# Patient Record
Sex: Male | Born: 1970 | Race: White | Hispanic: No | Smoking: Current every day smoker
Health system: Southern US, Community
[De-identification: ages and names within clinical notes are randomized; demographics above are authoritative.]

## PROBLEM LIST (undated history)

## (undated) DIAGNOSIS — G43909 Migraine, unspecified, not intractable, without status migrainosus: Secondary | ICD-10-CM

## (undated) DIAGNOSIS — I4891 Unspecified atrial fibrillation: Secondary | ICD-10-CM

## (undated) DIAGNOSIS — R519 Headache, unspecified: Secondary | ICD-10-CM

## (undated) DIAGNOSIS — R51 Headache: Secondary | ICD-10-CM

## (undated) HISTORY — PX: WISDOM TOOTH EXTRACTION: SHX21

---

## 2001-10-08 ENCOUNTER — Encounter: Admission: RE | Admit: 2001-10-08 | Discharge: 2001-10-08 | Payer: Self-pay | Admitting: Family Medicine

## 2001-10-08 ENCOUNTER — Encounter: Payer: Self-pay | Admitting: Family Medicine

## 2005-06-22 ENCOUNTER — Emergency Department (HOSPITAL_COMMUNITY): Admission: EM | Admit: 2005-06-22 | Discharge: 2005-06-22 | Payer: Self-pay | Admitting: Emergency Medicine

## 2015-08-03 ENCOUNTER — Encounter (HOSPITAL_COMMUNITY): Payer: Self-pay | Admitting: Emergency Medicine

## 2015-08-03 ENCOUNTER — Observation Stay (HOSPITAL_COMMUNITY)
Admission: EM | Admit: 2015-08-03 | Discharge: 2015-08-05 | Disposition: A | Discharge status: Home / Self Care | Payer: Commercial Managed Care - HMO | Attending: General Surgery | Admitting: General Surgery

## 2015-08-03 ENCOUNTER — Emergency Department (HOSPITAL_COMMUNITY): Payer: Commercial Managed Care - HMO

## 2015-08-03 ENCOUNTER — Observation Stay (HOSPITAL_COMMUNITY): Payer: Commercial Managed Care - HMO

## 2015-08-03 DIAGNOSIS — Z01810 Encounter for preprocedural cardiovascular examination: Secondary | ICD-10-CM

## 2015-08-03 DIAGNOSIS — K810 Acute cholecystitis: Secondary | ICD-10-CM

## 2015-08-03 DIAGNOSIS — R109 Unspecified abdominal pain: Secondary | ICD-10-CM

## 2015-08-03 DIAGNOSIS — F17210 Nicotine dependence, cigarettes, uncomplicated: Secondary | ICD-10-CM | POA: Diagnosis not present

## 2015-08-03 DIAGNOSIS — K8012 Calculus of gallbladder with acute and chronic cholecystitis without obstruction: Principal | ICD-10-CM | POA: Insufficient documentation

## 2015-08-03 DIAGNOSIS — R1013 Epigastric pain: Secondary | ICD-10-CM

## 2015-08-03 DIAGNOSIS — I4891 Unspecified atrial fibrillation: Secondary | ICD-10-CM | POA: Diagnosis not present

## 2015-08-03 DIAGNOSIS — R079 Chest pain, unspecified: Secondary | ICD-10-CM | POA: Diagnosis present

## 2015-08-03 HISTORY — DX: Headache: R51

## 2015-08-03 HISTORY — DX: Headache, unspecified: R51.9

## 2015-08-03 HISTORY — DX: Unspecified atrial fibrillation: I48.91

## 2015-08-03 HISTORY — DX: Migraine, unspecified, not intractable, without status migrainosus: G43.909

## 2015-08-03 LAB — I-STAT CHEM 8, ED
BUN: 11 mg/dL (ref 6–20)
CALCIUM ION: 1.06 mmol/L — AB (ref 1.12–1.23)
Chloride: 103 mmol/L (ref 101–111)
Creatinine, Ser: 0.8 mg/dL (ref 0.61–1.24)
Glucose, Bld: 102 mg/dL — ABNORMAL HIGH (ref 65–99)
HCT: 48 % (ref 39.0–52.0)
HEMOGLOBIN: 16.3 g/dL (ref 13.0–17.0)
Potassium: 4.4 mmol/L (ref 3.5–5.1)
SODIUM: 138 mmol/L (ref 135–145)
TCO2: 23 mmol/L (ref 0–100)

## 2015-08-03 LAB — COMPREHENSIVE METABOLIC PANEL
ALT: 18 U/L (ref 17–63)
ANION GAP: 9 (ref 5–15)
AST: 23 U/L (ref 15–41)
Albumin: 3.7 g/dL (ref 3.5–5.0)
Alkaline Phosphatase: 46 U/L (ref 38–126)
BUN: 7 mg/dL (ref 6–20)
CALCIUM: 8.9 mg/dL (ref 8.9–10.3)
CO2: 22 mmol/L (ref 22–32)
Chloride: 105 mmol/L (ref 101–111)
Creatinine, Ser: 0.86 mg/dL (ref 0.61–1.24)
GFR calc Af Amer: >60 mL/min (ref >60)
GLUCOSE: 106 mg/dL — AB (ref 65–99)
Potassium: 4.2 mmol/L (ref 3.5–5.1)
Sodium: 136 mmol/L (ref 135–145)
TOTAL PROTEIN: 6.4 g/dL — AB (ref 6.5–8.1)
Total Bilirubin: 0.7 mg/dL (ref 0.3–1.2)

## 2015-08-03 LAB — CBC WITH DIFFERENTIAL/PLATELET
Basophils Absolute: 0 10*3/uL (ref 0.0–0.1)
Basophils Relative: 0 %
Eosinophils Absolute: 0 10*3/uL (ref 0.0–0.7)
Eosinophils Relative: 0 %
HEMATOCRIT: 44.5 % (ref 39.0–52.0)
HEMOGLOBIN: 15.2 g/dL (ref 13.0–17.0)
LYMPHS ABS: 1.6 10*3/uL (ref 0.7–4.0)
LYMPHS PCT: 15 %
MCH: 31.3 pg (ref 26.0–34.0)
MCHC: 34.2 g/dL (ref 30.0–36.0)
MCV: 91.8 fL (ref 78.0–100.0)
MONOS PCT: 9 %
Monocytes Absolute: 1 10*3/uL (ref 0.1–1.0)
NEUTROS ABS: 8.2 10*3/uL — AB (ref 1.7–7.7)
NEUTROS PCT: 76 %
Platelets: 231 10*3/uL (ref 150–400)
RBC: 4.85 MIL/uL (ref 4.22–5.81)
RDW: 12.2 % (ref 11.5–15.5)
WBC: 10.8 10*3/uL — ABNORMAL HIGH (ref 4.0–10.5)

## 2015-08-03 LAB — SURGICAL PCR SCREEN
MRSA, PCR: NEGATIVE
STAPHYLOCOCCUS AUREUS: NEGATIVE

## 2015-08-03 LAB — I-STAT TROPONIN, ED
TROPONIN I, POC: 0 ng/mL (ref 0.00–0.08)
Troponin i, poc: 0 ng/mL (ref 0.00–0.08)
Troponin i, poc: 0 ng/mL (ref 0.00–0.08)

## 2015-08-03 LAB — MAGNESIUM: Magnesium: 1.7 mg/dL (ref 1.7–2.4)

## 2015-08-03 LAB — CREATININE, SERUM
Creatinine, Ser: 0.91 mg/dL (ref 0.61–1.24)
GFR calc Af Amer: >60 mL/min (ref >60)

## 2015-08-03 LAB — LIPASE, BLOOD: Lipase: 16 U/L (ref 11–51)

## 2015-08-03 MED ORDER — PIPERACILLIN-TAZOBACTAM 3.375 G IVPB 30 MIN
3.3750 g | Freq: Once | INTRAVENOUS | Status: AC
  Administered 2015-08-03: 3.375 g via INTRAVENOUS
  Filled 2015-08-03: qty 50

## 2015-08-03 MED ORDER — DEXTROSE 5 % IV SOLN
2.0000 g | INTRAVENOUS | Status: DC
  Administered 2015-08-03 – 2015-08-04 (×2): 2 g via INTRAVENOUS
  Filled 2015-08-03 (×2): qty 2

## 2015-08-03 MED ORDER — IOPAMIDOL (ISOVUE-370) INJECTION 76%
INTRAVENOUS | Status: AC
  Administered 2015-08-03: 100 mL
  Filled 2015-08-03: qty 100

## 2015-08-03 MED ORDER — DIPHENHYDRAMINE HCL 25 MG PO CAPS
25.0000 mg | ORAL_CAPSULE | Freq: Four times a day (QID) | ORAL | Status: DC | PRN
  Administered 2015-08-04: 25 mg via ORAL
  Filled 2015-08-03: qty 1

## 2015-08-03 MED ORDER — DIPHENHYDRAMINE HCL 50 MG/ML IJ SOLN: 25.0000 mg | Freq: Four times a day (QID) | INTRAMUSCULAR | Status: DC | PRN

## 2015-08-03 MED ORDER — ACETAMINOPHEN 650 MG RE SUPP: 650.0000 mg | Freq: Four times a day (QID) | RECTAL | Status: DC | PRN

## 2015-08-03 MED ORDER — SODIUM CHLORIDE 0.9 % IV BOLUS (SEPSIS)
1000.0000 mL | Freq: Once | INTRAVENOUS | Status: AC
  Administered 2015-08-03: 1000 mL via INTRAVENOUS

## 2015-08-03 MED ORDER — ONDANSETRON 4 MG PO TBDP: 4.0000 mg | ORAL_TABLET | Freq: Four times a day (QID) | ORAL | Status: DC | PRN

## 2015-08-03 MED ORDER — HYDROMORPHONE HCL 1 MG/ML IJ SOLN
1.0000 mg | Freq: Once | INTRAMUSCULAR | Status: AC
  Administered 2015-08-03: 1 mg via INTRAVENOUS
  Filled 2015-08-03: qty 1

## 2015-08-03 MED ORDER — NICOTINE 21 MG/24HR TD PT24
21.0000 mg | MEDICATED_PATCH | Freq: Every day | TRANSDERMAL | Status: DC
  Administered 2015-08-03 – 2015-08-05 (×3): 21 mg via TRANSDERMAL
  Filled 2015-08-03 (×3): qty 1

## 2015-08-03 MED ORDER — ACETAMINOPHEN 325 MG PO TABS
650.0000 mg | ORAL_TABLET | Freq: Four times a day (QID) | ORAL | Status: DC | PRN
  Administered 2015-08-03 – 2015-08-04 (×2): 650 mg via ORAL
  Filled 2015-08-03 (×2): qty 2

## 2015-08-03 MED ORDER — HYDROMORPHONE HCL 1 MG/ML IJ SOLN
1.0000 mg | INTRAMUSCULAR | Status: DC | PRN
  Administered 2015-08-03: 2 mg via INTRAVENOUS
  Administered 2015-08-04: 1 mg via INTRAVENOUS
  Administered 2015-08-04: 2 mg via INTRAVENOUS
  Filled 2015-08-03 (×2): qty 2
  Filled 2015-08-03: qty 1

## 2015-08-03 MED ORDER — OXYCODONE-ACETAMINOPHEN 5-325 MG PO TABS
1.0000 | ORAL_TABLET | ORAL | Status: DC | PRN
  Administered 2015-08-03 – 2015-08-05 (×4): 2 via ORAL
  Filled 2015-08-03 (×4): qty 2

## 2015-08-03 MED ORDER — HYDROMORPHONE HCL 1 MG/ML IJ SOLN
1.0000 mg | INTRAMUSCULAR | Status: DC | PRN
Start: 2015-08-03 — End: 2015-08-03

## 2015-08-03 MED ORDER — ENOXAPARIN SODIUM 40 MG/0.4ML ~~LOC~~ SOLN
40.0000 mg | SUBCUTANEOUS | Status: DC
  Administered 2015-08-03 – 2015-08-04 (×2): 40 mg via SUBCUTANEOUS
  Filled 2015-08-03 (×2): qty 0.4

## 2015-08-03 MED ORDER — ONDANSETRON HCL 4 MG/2ML IJ SOLN
4.0000 mg | Freq: Four times a day (QID) | INTRAMUSCULAR | Status: DC | PRN
  Administered 2015-08-03: 4 mg via INTRAVENOUS
  Filled 2015-08-03: qty 2

## 2015-08-03 NOTE — ED Provider Notes (Signed)
CSN: [id number]     Arrival date & time 08/03/15  [id number] History   First MD Initiated Contact with Patient 08/03/15 [phone]     Chief Complaint  Patient presents with  . Chest Pain     (Consider location/radiation/quality/duration/timing/severity/associated sxs/prior Treatment) Patient is a 45 y.o. male presenting with chest pain.  Chest Pain Pain location:  Epigastric Pain quality comment:  "like I was hit" Pain radiates to the back: no   Pain severity:  Severe Onset quality:  Sudden Duration:  7 hours Timing:  Constant Progression:  Worsening Chronicity:  New Context comment:  Takes goody powders regularly Relieved by:  Nothing Worsened by:  Nothing tried Ineffective treatments:  Certain positions, leaning forward and nitroglycerin Associated symptoms: abdominal pain   Associated symptoms: no back pain, no cough, no diaphoresis, no fever, no headache, no nausea, no shortness of breath, no syncope and no weakness   Risk factors: smoking   Risk factors: no coronary artery disease, no diabetes mellitus, no high cholesterol and no hypertension     Past Medical History  Diagnosis Date  . Atrial fibrillation (HCC)   . Headache     "q couple weeks" (08/03/2015)  . Migraine     "a couple/year" (08/03/2015)   Past Surgical History  Procedure Laterality Date  . Wisdom tooth extraction  ~ 2015   History reviewed. No pertinent family history. Social History  Substance Use Topics  . Smoking status: Current Every Day Smoker -- 1.00 packs/day for 27 years    Types: Cigarettes  . Smokeless tobacco: Never Used  . Alcohol Use: 12.6 oz/week    21 Cans of beer per week     Comment: 2-3 beers per day    Review of Systems  Constitutional: Negative for fever and diaphoresis.  HENT: Negative for sore throat.   Eyes: Negative for visual disturbance.  Respiratory: Negative for cough and shortness of breath.   Cardiovascular: Positive for chest pain. Negative for syncope.  Gastrointestinal:  Positive for abdominal pain. Negative for nausea.  Genitourinary: Negative for difficulty urinating.  Musculoskeletal: Negative for back pain and neck stiffness.  Skin: Negative for rash.  Neurological: Negative for syncope, weakness and headaches.      Allergies  Review of patient's allergies indicates no known allergies.  Home Medications   Prior to Admission medications   Medication Sig Start Date End Date Taking? Authorizing Provider  Aspirin-Acetaminophen-Caffeine (GOODY HEADACHE PO) Take 2 packets by mouth every 6 (six) hours as needed (pain).   Yes Historical Provider, MD  Diphenhydramine-APAP, sleep, (GOODY PM PO) Take 2 Packages by mouth at bedtime as needed (pain).   Yes Historical Provider, MD   BP 133/78 mmHg  Pulse 88  Temp(Src) 98.7 F (37.1 C) (Oral)  Resp 18  Ht 6' (1.829 m)  Wt 207 lb 3.2 oz (93.985 kg)  BMI 28.10 kg/m2  SpO2 96% Physical Exam  Constitutional: He is oriented to person, place, and time. He appears well-developed and well-nourished. No distress.  HENT:  Head: Normocephalic and atraumatic.  Eyes: Conjunctivae and EOM are normal.  Neck: Normal range of motion.  Cardiovascular: Normal rate, regular rhythm, normal heart sounds and intact distal pulses.  Exam reveals no gallop and no friction rub.   No murmur heard. Pulmonary/Chest: Effort normal and breath sounds normal. No respiratory distress. He has no wheezes. He has no rales.  Abdominal: He exhibits no distension. There is tenderness (guarding throughout, worse in epigastrum) in the epigastric area. There is guarding  and CVA tenderness (left). Rebound: reports they feel the same.  Musculoskeletal: He exhibits no edema.  Neurological: He is alert and oriented to person, place, and time.  Skin: Skin is warm and dry. He is not diaphoretic.  Nursing note and vitals reviewed.   ED Course  Procedures (including critical care time) Labs Review Labs Reviewed  COMPREHENSIVE METABOLIC PANEL -  Abnormal; Notable for the following:    Glucose, Bld 106 (*)    Total Protein 6.4 (*)    All other components within normal limits  CBC WITH DIFFERENTIAL/PLATELET - Abnormal; Notable for the following:    WBC 10.8 (*)    Neutro Abs 8.2 (*)    All other components within normal limits  I-STAT CHEM 8, ED - Abnormal; Notable for the following:    Glucose, Bld 102 (*)    Calcium, Ion 1.06 (*)    All other components within normal limits  SURGICAL PCR SCREEN  MAGNESIUM  LIPASE, BLOOD  CREATININE, SERUM  COMPREHENSIVE METABOLIC PANEL  CBC  I-STAT TROPOININ, ED  I-STAT TROPOININ, ED  I-STAT TROPOININ, ED    Imaging Review Dg Abd Acute W/chest  08/03/2015  CLINICAL DATA:  Severe upper abdominal pain beginning at 1 a.m. this morning. Initial encounter. EXAM: DG ABDOMEN ACUTE W/ 1V CHEST COMPARISON:  Plain film of the chest 05/05/2014. FINDINGS: Single view of the chest demonstrates clear lungs and normal heart size. No pneumothorax or pleural effusion. Two views of the abdomen show no free intraperitoneal air. There is no evidence of bowel obstruction. Large volume of stool ascending and transverse colon noted. No abnormal abdominal calcification or focal bony abnormality. IMPRESSION: No acute finding chest or abdomen. Large stool burden ascending and transverse colon. Electronically Signed   By: Drusilla Kanner M.D.   On: 08/03/2015 08:32   Ct Angio Chest/abd/pel For Dissection W And/or W/wo  08/03/2015  CLINICAL DATA:  Chest and abdominal pain for 1 day. History of atrial fibrillation EXAM: CT ANGIOGRAPHY CHEST, ABDOMEN AND PELVIS TECHNIQUE: Initially, axial CT images were obtained through the chest without intravenous contrast material administration. Multidetector CT imaging through the chest, abdomen and pelvis was performed using the standard protocol during bolus administration of intravenous contrast. Multiplanar reconstructed images and MIPs were obtained and reviewed to evaluate the  vascular anatomy. CONTRAST:  100 mL Isovue 370 nonionic COMPARISON:  Abdomen series Aug 03, 2015 FINDINGS: CTA CHEST FINDINGS There is no thoracic aortic aneurysm or dissection. Visualized great vessels appear normal. There are a few small areas of calcification in coronary arteries. Pericardium is not appreciably thickened. There is no demonstrable pulmonary embolus. Visualized thyroid appears unremarkable. There is no appreciable thoracic adenopathy. There is a minimal hiatal hernia. There are scattered areas of patchy atelectasis in both lower lobes. There is no lung edema or consolidation. There are no blastic or lytic bone lesions. Review of the MIP images confirms the above findings. CTA ABDOMEN AND PELVIS FINDINGS There is a degree of hepatic steatosis. No focal liver lesions are identified. There is pericholecystic fluid surrounding a gallbladder which appears somewhat distended. Gallbladder wall also appears slightly thickened. There is no appreciable biliary duct dilatation. Pancreas appears normal without mass or inflammatory focus. No splenic lesions are evident. Right adrenal appears normal. There is a 9 x 9 mm left adrenal adenoma. Kidneys bilaterally show no hydronephrosis. There is a cyst measuring 8 x 8 mm arising from the lower pole the right kidney. There is a cyst arising from the lower pole  left kidney measuring 1.1 x 1.1 cm. There is a cyst in upper pole left kidney anteriorly measuring 1.1 x 1.1 cm. There is no renal or ureteral calculus on either side. Urinary bladder is midline with wall thickness within normal limits. The abdominal aorta shows no evidence of aneurysm or dissection. There is mild atherosclerotic plaque in the distal aorta without appreciable vessel narrowing. The celiac, superior mesenteric, and inferior mesenteric arteries appear widely patent. Single renal arteries are patent bilaterally. In the pelvis, there is no aneurysm or dissection. There is no appreciable vessel  narrowing involving major pelvic arterial vessels. In the pelvis, there is no mass or inflammatory focus. Prostate and seminal vesicles appear normal in size and contour. There is no bowel wall or mesenteric thickening. No bowel obstruction. No free air or portal venous air. The appendix appears normal. There is no ascites, adenopathy, or abscess in the abdomen or pelvis. There are no blastic or lytic bone lesions. There is no intramuscular or abdominal wall lesion. There is a minimal ventral hernia containing only fat. Review of the MIP images confirms the above findings. IMPRESSION: CT angiogram chest: Mild lower lobe atelectatic change. No edema or consolidation. No thoracic adenopathy. No thoracic aortic aneurysm or dissection. No pulmonary embolus evident. CT angiogram abdomen and pelvis: Findings concerning for acalculus cholecystitis. Correlation with ultrasound of the gallbladder advised. Evidence hepatic steatosis.  No focal liver lesions evident. Mild atherosclerotic change in the distal abdominal aorta. No aortic or major pelvic arterial vessel aneurysm or dissection. Major mesenteric vessels as well as the pelvic arterial vessels appear widely patent. No bowel obstruction. No bowel inflammation. Appendix appears normal. No abscess or ascites. No renal or ureteral calculus. No hydronephrosis. Minimal ventral hernia containing only fat. Electronically Signed   By: Bretta Bang III M.D.   On: 08/03/2015 09:54   US Abdomen Limited Ruq  08/03/2015  CLINICAL DATA:  Epigastric pain since 1 a.m. last night. No known injury. Initial encounter. EXAM: US ABDOMEN LIMITED - RIGHT UPPER QUADRANT COMPARISON:  CT chest, abdomen and pelvis 08/03/2015. FINDINGS: Gallbladder: Innumerable stones are identified in the gallbladder including a 2.5 cm stone in the gallbladder neck. The gallbladder wall is thickened at 0.8 cm. No pericholecystic fluid is identified. Sonographer reports a positive Murphy sign. Common bile  duct: Diameter: 0.6 cm Liver: A 1.1 cm in diameter hyperechoic lesion in the right hepatic lobe is most compatible with a hemangioma. Within normal limits in parenchymal echogenicity. IMPRESSION: Findings consistent with acute cholecystitis and patient with multiple gallstones including a 2.5 cm stone in the gallbladder neck. Electronically Signed   By: Drusilla Kanner M.D.   On: 08/03/2015 11:10   I have personally reviewed and evaluated these images and lab results as part of my medical decision-making.   EKG Interpretation   Date/Time:  Monday Aug 03 2015 07:54:12 EDT Ventricular Rate:  90 PR Interval:  116 QRS Duration: 84 QT Interval:  326 QTC Calculation: 399 R Axis:   101 Text Interpretation:  Sinus rhythm Borderline short PR interval Right axis  deviation No significant change since last tracing Confirmed by [facility]  MD, Devin Foskey ([id number]) on 08/03/2015 9:00:10 AM      MDM   Final diagnoses:  Abdominal pain  Epigastric abdominal pain  Acute cholecystitis   45 year old male with hx of smoking, (prior stress testing however did not see other physicians after given lack of insurance) presents with concern for lower chest/epigastric pain.  Sudden onset and severe pain with  perforated viscous in differential and acute abdomen with chest ordered for evaluation which did not show acute findings. EKG without acute changes.    CTA dissection study was ordered which showed no sign of dissection. There were findings concerning for a calculus cholecystitis on CT, and right upper quadrant ultrasound was subsequently ordered.  Patient is low risk HEART score with 2 negative troponins.    RUQ US shows acute cholecystitis.  General surgery consulted. Pt given zosyn and dilaudid.   Alvira Monday, MD 08/03/15 [id number]

## 2015-08-03 NOTE — H&P (Signed)
Chief Complaint: abdominal pain HPI: Timothy Glover is a 45 year old male with a history of tobacco use and remote atrial fibrillation who presents with epigastric abdominal pain which started suddenly at 1AM this morning.  He reports intermittent pain between 1-3AM over the last several months, but would alleviate on its own.  Symptoms are severe. Time pattern is constant.  No aggravating or alleviating factors.  Denies previous post prandial symptoms.  Denies fever, chills or sweats.  Denies diarrhea, constipation.  ED work up reveals, WBC 10.8k.  Normal LFTs.  CT angio significant for acute cholecystitis.  Followed by an [place] which confirmed stones and cholecystitis.  We have therefore been asked to evaluate.   Past Medical History  Diagnosis Date  . Atrial fibrillation ([place])     History reviewed. No pertinent past surgical history.  History reviewed. No pertinent family history. Social History:  reports that he has been smoking Cigarettes.  He does not have any smokeless tobacco history on file. He reports that he drinks alcohol. His drug history is not on file.  Allergies: No Known Allergies   (Not in a hospital admission)  Results for orders placed or performed during the hospital encounter of 08/03/15 (from the past 48 hour(s))  Comprehensive metabolic panel     Status: Abnormal   Collection Time: 08/03/15  8:12 AM  Result Value Ref Range   Sodium 136 135 - 145 mmol/L   Potassium 4.2 3.5 - 5.1 mmol/L   Chloride 105 101 - 111 mmol/L   CO2 22 22 - 32 mmol/L   Glucose, Bld 106 (H) 65 - 99 mg/dL   BUN 7 6 - 20 mg/dL   Creatinine, Ser 0.86 0.61 - 1.24 mg/dL   Calcium 8.9 8.9 - 10.3 mg/dL   Total Protein 6.4 (L) 6.5 - 8.1 g/dL   Albumin 3.7 3.5 - 5.0 g/dL   AST 23 15 - 41 U/L   ALT 18 17 - 63 U/L   Alkaline Phosphatase 46 38 - 126 U/L   Total Bilirubin 0.7 0.3 - 1.2 mg/dL   GFR calc non Af Amer >60 >60 mL/min   GFR calc Af Amer >60 >60 mL/min    Comment: (NOTE) The eGFR has  been calculated using the CKD EPI equation. This calculation has not been validated in all clinical situations. eGFR's persistently <60 mL/min signify possible Chronic Kidney Disease.    Anion gap 9 5 - 15  Magnesium     Status: None   Collection Time: 08/03/15  8:12 AM  Result Value Ref Range   Magnesium 1.7 1.7 - 2.4 mg/dL  Lipase, blood     Status: None   Collection Time: 08/03/15  8:12 AM  Result Value Ref Range   Lipase 16 11 - 51 U/L  I-stat troponin, ED (0, 3, 6)  not at [facility], [facility]     Status: None   Collection Time: 08/03/15  8:19 AM  Result Value Ref Range   Troponin i, poc 0.00 0.00 - 0.08 ng/mL   Comment 3            Comment: Due to the release kinetics of cTnI, a negative result within the first hours of the onset of symptoms does not rule out myocardial infarction with certainty. If myocardial infarction is still suspected, repeat the test at appropriate intervals.   I-Stat Chem 8, ED  (not at [facility], [facility])     Status: Abnormal   Collection Time: 08/03/15  8:20 AM  Result Value Ref Range   Sodium 138 135 - 145 mmol/L   Potassium 4.4 3.5 - 5.1 mmol/L   Chloride 103 101 - 111 mmol/L   BUN 11 6 - 20 mg/dL   Creatinine, Ser 0.80 0.61 - 1.24 mg/dL   Glucose, Bld 102 (H) 65 - 99 mg/dL   Calcium, Ion 1.06 (L) 1.12 - 1.23 mmol/L   TCO2 23 0 - 100 mmol/L   Hemoglobin 16.3 13.0 - 17.0 g/dL   HCT 48.0 39.0 - 52.0 %  I-stat troponin, ED (0, 3, 6)  not at [facility], [facility]     Status: None   Collection Time: 08/03/15 11:33 AM  Result Value Ref Range   Troponin i, poc 0.00 0.00 - 0.08 ng/mL   Comment 3            Comment: Due to the release kinetics of cTnI, a negative result within the first hours of the onset of symptoms does not rule out myocardial infarction with certainty. If myocardial infarction is still suspected, repeat the test at appropriate intervals.   CBC with Differential     Status: Abnormal   Collection Time: 08/03/15 12:25 PM  Result Value Ref Range   WBC  10.8 (H) 4.0 - 10.5 K/uL   RBC 4.85 4.22 - 5.81 MIL/uL   Hemoglobin 15.2 13.0 - 17.0 g/dL   HCT 44.5 39.0 - 52.0 %   MCV 91.8 78.0 - 100.0 fL   MCH 31.3 26.0 - 34.0 pg   MCHC 34.2 30.0 - 36.0 g/dL   RDW 12.2 11.5 - 15.5 %   Platelets 231 150 - 400 K/uL   Neutrophils Relative % 76 %   Neutro Abs 8.2 (H) 1.7 - 7.7 K/uL   Lymphocytes Relative 15 %   Lymphs Abs 1.6 0.7 - 4.0 K/uL   Monocytes Relative 9 %   Monocytes Absolute 1.0 0.1 - 1.0 K/uL   Eosinophils Relative 0 %   Eosinophils Absolute 0.0 0.0 - 0.7 K/uL   Basophils Relative 0 %   Basophils Absolute 0.0 0.0 - 0.1 K/uL   Dg Abd Acute W/chest  08/03/2015  CLINICAL DATA:  Severe upper abdominal pain beginning at 1 a.m. this morning. Initial encounter. EXAM: DG ABDOMEN ACUTE W/ 1V CHEST COMPARISON:  Plain film of the chest 05/05/2014. FINDINGS: Single view of the chest demonstrates clear lungs and normal heart size. No pneumothorax or pleural effusion. Two views of the abdomen show no free intraperitoneal air. There is no evidence of bowel obstruction. Large volume of stool ascending and transverse colon noted. No abnormal abdominal calcification or focal bony abnormality. IMPRESSION: No acute finding chest or abdomen. Large stool burden ascending and transverse colon. Electronically Signed   By: Inge Rise M.D.   On: 08/03/2015 08:32   Ct Angio Chest/abd/pel For Dissection W And/or W/wo  08/03/2015  CLINICAL DATA:  Chest and abdominal pain for 1 day. History of atrial fibrillation EXAM: CT ANGIOGRAPHY CHEST, ABDOMEN AND PELVIS TECHNIQUE: Initially, axial CT images were obtained through the chest without intravenous contrast material administration. Multidetector CT imaging through the chest, abdomen and pelvis was performed using the standard protocol during bolus administration of intravenous contrast. Multiplanar reconstructed images and MIPs were obtained and reviewed to evaluate the vascular anatomy. CONTRAST:  100 mL Isovue 370  nonionic COMPARISON:  Abdomen series Aug 03, 2015 FINDINGS: CTA CHEST FINDINGS There is no thoracic aortic aneurysm or dissection. Visualized great vessels appear normal. There are a few small areas of  calcification in coronary arteries. Pericardium is not appreciably thickened. There is no demonstrable pulmonary embolus. Visualized thyroid appears unremarkable. There is no appreciable thoracic adenopathy. There is a minimal hiatal hernia. There are scattered areas of patchy atelectasis in both lower lobes. There is no lung edema or consolidation. There are no blastic or lytic bone lesions. Review of the MIP images confirms the above findings. CTA ABDOMEN AND PELVIS FINDINGS There is a degree of hepatic steatosis. No focal liver lesions are identified. There is pericholecystic fluid surrounding a gallbladder which appears somewhat distended. Gallbladder wall also appears slightly thickened. There is no appreciable biliary duct dilatation. Pancreas appears normal without mass or inflammatory focus. No splenic lesions are evident. Right adrenal appears normal. There is a 9 x 9 mm left adrenal adenoma. Kidneys bilaterally show no hydronephrosis. There is a cyst measuring 8 x 8 mm arising from the lower pole the right kidney. There is a cyst arising from the lower pole left kidney measuring 1.1 x 1.1 cm. There is a cyst in upper pole left kidney anteriorly measuring 1.1 x 1.1 cm. There is no renal or ureteral calculus on either side. Urinary bladder is midline with wall thickness within normal limits. The abdominal aorta shows no evidence of aneurysm or dissection. There is mild atherosclerotic plaque in the distal aorta without appreciable vessel narrowing. The celiac, superior mesenteric, and inferior mesenteric arteries appear widely patent. Single renal arteries are patent bilaterally. In the pelvis, there is no aneurysm or dissection. There is no appreciable vessel narrowing involving major pelvic arterial vessels.  In the pelvis, there is no mass or inflammatory focus. Prostate and seminal vesicles appear normal in size and contour. There is no bowel wall or mesenteric thickening. No bowel obstruction. No free air or portal venous air. The appendix appears normal. There is no ascites, adenopathy, or abscess in the abdomen or pelvis. There are no blastic or lytic bone lesions. There is no intramuscular or abdominal wall lesion. There is a minimal ventral hernia containing only fat. Review of the MIP images confirms the above findings. IMPRESSION: CT angiogram chest: Mild lower lobe atelectatic change. No edema or consolidation. No thoracic adenopathy. No thoracic aortic aneurysm or dissection. No pulmonary embolus evident. CT angiogram abdomen and pelvis: Findings concerning for acalculus cholecystitis. Correlation with ultrasound of the gallbladder advised. Evidence hepatic steatosis.  No focal liver lesions evident. Mild atherosclerotic change in the distal abdominal aorta. No aortic or major pelvic arterial vessel aneurysm or dissection. Major mesenteric vessels as well as the pelvic arterial vessels appear widely patent. No bowel obstruction. No bowel inflammation. Appendix appears normal. No abscess or ascites. No renal or ureteral calculus. No hydronephrosis. Minimal ventral hernia containing only fat. Electronically Signed   By: Lowella Grip III M.D.   On: 08/03/2015 09:54   US Abdomen Limited Ruq  08/03/2015  CLINICAL DATA:  Epigastric pain since 1 a.m. last night. No known injury. Initial encounter. EXAM: US ABDOMEN LIMITED - RIGHT UPPER QUADRANT COMPARISON:  CT chest, abdomen and pelvis 08/03/2015. FINDINGS: Gallbladder: Innumerable stones are identified in the gallbladder including a 2.5 cm stone in the gallbladder neck. The gallbladder wall is thickened at 0.8 cm. No pericholecystic fluid is identified. Sonographer reports a positive Murphy sign. Common bile duct: Diameter: 0.6 cm Liver: A 1.1 cm in diameter  hyperechoic lesion in the right hepatic lobe is most compatible with a hemangioma. Within normal limits in parenchymal echogenicity. IMPRESSION: Findings consistent with acute cholecystitis and patient with multiple  gallstones including a 2.5 cm stone in the gallbladder neck. Electronically Signed   By: Inge Rise M.D.   On: 08/03/2015 11:10    Review of Systems  Constitutional: Negative for fever, chills, weight loss, malaise/fatigue and diaphoresis.  Eyes: Negative for blurred vision, double vision, photophobia, pain, discharge and redness.  Respiratory: Negative for cough, hemoptysis, sputum production, shortness of breath and wheezing.   Cardiovascular: Negative for chest pain, palpitations, orthopnea, claudication, leg swelling and PND.  Gastrointestinal: Positive for abdominal pain. Negative for heartburn, nausea, vomiting, diarrhea, constipation, blood in stool and melena.  Genitourinary: Negative for dysuria, urgency, frequency, hematuria and flank pain.  Musculoskeletal: Negative for myalgias, back pain, joint pain, falls and neck pain.  Neurological: Negative for dizziness, tingling, tremors, sensory change, speech change, focal weakness, seizures, loss of consciousness and weakness.    Blood pressure 141/95, pulse 97, temperature 98.1 F (36.7 C), temperature source Oral, resp. rate 21, height _0  (1.854 m), weight 111.131 kg (245 lb), SpO2 94 %. Physical Exam  Constitutional: He is oriented to person, place, and time. He appears well-developed and well-nourished. No distress.  Cardiovascular: Normal rate, regular rhythm, normal heart sounds and intact distal pulses.  Exam reveals no gallop and no friction rub.   No murmur heard. Respiratory: Effort normal and breath sounds normal. No respiratory distress. He has no wheezes. He has no rales. He exhibits no tenderness.  GI: Soft. Bowel sounds are normal. He exhibits no distension and no mass.  Mild diffuse tenderness,  voluntary guarding, +murphy's sign  Musculoskeletal: Normal range of motion. He exhibits no edema or tenderness.  Neurological: He is alert and oriented to person, place, and time.  Skin: Skin is warm and dry. No rash noted. He is not diaphoretic. No erythema. No pallor.  Psychiatric: He has a normal mood and affect. His behavior is normal. Judgment and thought content normal.     Assessment/Plan Acute cholecystitis-admit patient for IV antibiotics, pain control, lap chole in AM.  Surgical risks discussed including infection, bleeding, injury to surrounding structures, open surgery, heart attack, DVT/PE, respiratory failure and death.  The patient verbalizes understanding and wishes to proceed. ID-rocephin Nicotine dependence-patch  Hx afib-cardiac monitor FEN-clears, NPO after midnight VTE prophylaxis-SCD lovenox Dispo-admit to floor   Erby Pian, NP 08/03/2015, 2:37 PM

## 2015-08-03 NOTE — ED Notes (Addendum)
xphoidal Cp that woke up this am from sleep about 1 am, coughing up some phlegm had some blood in it ,has iv 18 left ac was given 324 asa, 2 nitro and 6 of morphine no n/v/d/  No sob, states takes 6 goody powders a day for  Neck and back pain

## 2015-08-03 NOTE — ED Notes (Signed)
Patient transported to X-ray 

## 2015-08-03 NOTE — Progress Notes (Signed)
Pt admitted to 6N15 from ED.  Pt AAO X 4.  Pt on RA.  [facility]  Belongings and family member to bedside.  Report rcvd from Madelaine BhatAdam, RN.  Pt has no questions at the moment.  Will continue to monitor.

## 2015-08-04 ENCOUNTER — Observation Stay (HOSPITAL_COMMUNITY): Payer: Commercial Managed Care - HMO | Admitting: Anesthesiology

## 2015-08-04 ENCOUNTER — Encounter (HOSPITAL_COMMUNITY)
Admission: EM | Disposition: A | Discharge status: Home / Self Care | Payer: Self-pay | Source: Home / Self Care | Attending: Emergency Medicine

## 2015-08-04 HISTORY — PX: CHOLECYSTECTOMY: SHX55

## 2015-08-04 LAB — CBC
HCT: 44.9 % (ref 39.0–52.0)
HEMOGLOBIN: 15.3 g/dL (ref 13.0–17.0)
MCH: 31.6 pg (ref 26.0–34.0)
MCHC: 34.1 g/dL (ref 30.0–36.0)
MCV: 92.8 fL (ref 78.0–100.0)
PLATELETS: 261 10*3/uL (ref 150–400)
RBC: 4.84 MIL/uL (ref 4.22–5.81)
RDW: 12.2 % (ref 11.5–15.5)
WBC: 12 10*3/uL — ABNORMAL HIGH (ref 4.0–10.5)

## 2015-08-04 LAB — COMPREHENSIVE METABOLIC PANEL
ALBUMIN: 3.4 g/dL — AB (ref 3.5–5.0)
ALK PHOS: 56 U/L (ref 38–126)
ALT: 35 U/L (ref 17–63)
ANION GAP: 13 (ref 5–15)
AST: 22 U/L (ref 15–41)
BILIRUBIN TOTAL: 0.9 mg/dL (ref 0.3–1.2)
BUN: 6 mg/dL (ref 6–20)
CALCIUM: 8.8 mg/dL — AB (ref 8.9–10.3)
CO2: 24 mmol/L (ref 22–32)
CREATININE: 0.71 mg/dL (ref 0.61–1.24)
Chloride: 98 mmol/L — ABNORMAL LOW (ref 101–111)
GFR calc Af Amer: >60 mL/min (ref >60)
GFR calc non Af Amer: >60 mL/min (ref >60)
GLUCOSE: 105 mg/dL — AB (ref 65–99)
Potassium: 3.8 mmol/L (ref 3.5–5.1)
SODIUM: 135 mmol/L (ref 135–145)
TOTAL PROTEIN: 6.7 g/dL (ref 6.5–8.1)

## 2015-08-04 SURGERY — LAPAROSCOPIC CHOLECYSTECTOMY WITH INTRAOPERATIVE CHOLANGIOGRAM
Anesthesia: General | Site: Abdomen

## 2015-08-04 MED ORDER — FENTANYL CITRATE (PF) 250 MCG/5ML IJ SOLN
INTRAMUSCULAR | Status: AC
  Filled 2015-08-04: qty 5

## 2015-08-04 MED ORDER — SODIUM CHLORIDE 0.9 % IR SOLN
Status: DC | PRN
Start: 2015-08-04 — End: 2015-08-04
  Administered 2015-08-04: 1000 mL

## 2015-08-04 MED ORDER — BUPIVACAINE HCL (PF) 0.25 % IJ SOLN
INTRAMUSCULAR | Status: AC
  Filled 2015-08-04: qty 30

## 2015-08-04 MED ORDER — METOCLOPRAMIDE HCL 5 MG/ML IJ SOLN: 10.0000 mg | Freq: Once | INTRAMUSCULAR | Status: DC | PRN

## 2015-08-04 MED ORDER — ROCURONIUM BROMIDE 50 MG/5ML IV SOLN
INTRAVENOUS | Status: AC
  Filled 2015-08-04: qty 2

## 2015-08-04 MED ORDER — 0.9 % SODIUM CHLORIDE (POUR BTL) OPTIME
TOPICAL | Status: DC | PRN
Start: 2015-08-04 — End: 2015-08-04
  Administered 2015-08-04: 1000 mL

## 2015-08-04 MED ORDER — ROCURONIUM BROMIDE 100 MG/10ML IV SOLN
INTRAVENOUS | Status: DC | PRN
  Administered 2015-08-04: 50 mg via INTRAVENOUS
  Administered 2015-08-04: 10 mg via INTRAVENOUS

## 2015-08-04 MED ORDER — DEXAMETHASONE SODIUM PHOSPHATE 10 MG/ML IJ SOLN
INTRAMUSCULAR | Status: AC
  Filled 2015-08-04: qty 1

## 2015-08-04 MED ORDER — PROPOFOL 10 MG/ML IV BOLUS
INTRAVENOUS | Status: DC | PRN
  Administered 2015-08-04: 200 mg via INTRAVENOUS

## 2015-08-04 MED ORDER — DEXAMETHASONE SODIUM PHOSPHATE 10 MG/ML IJ SOLN
INTRAMUSCULAR | Status: DC | PRN
  Administered 2015-08-04: 10 mg via INTRAVENOUS

## 2015-08-04 MED ORDER — ROCURONIUM BROMIDE 50 MG/5ML IV SOLN
INTRAVENOUS | Status: AC
  Filled 2015-08-04: qty 1

## 2015-08-04 MED ORDER — BUPIVACAINE HCL 0.25 % IJ SOLN
INTRAMUSCULAR | Status: DC | PRN
  Administered 2015-08-04: 7 mL

## 2015-08-04 MED ORDER — ARTIFICIAL TEARS OP OINT
TOPICAL_OINTMENT | OPHTHALMIC | Status: DC | PRN
  Administered 2015-08-04: 1 via OPHTHALMIC

## 2015-08-04 MED ORDER — SUGAMMADEX SODIUM 200 MG/2ML IV SOLN
INTRAVENOUS | Status: DC | PRN
  Administered 2015-08-04: 188 mg via INTRAVENOUS

## 2015-08-04 MED ORDER — IOPAMIDOL (ISOVUE-300) INJECTION 61%
INTRAVENOUS | Status: AC
  Filled 2015-08-04: qty 50

## 2015-08-04 MED ORDER — FENTANYL CITRATE (PF) 100 MCG/2ML IJ SOLN
INTRAMUSCULAR | Status: DC | PRN
  Administered 2015-08-04: 50 ug via INTRAVENOUS
  Administered 2015-08-04: 100 ug via INTRAVENOUS
  Administered 2015-08-04 (×4): 50 ug via INTRAVENOUS

## 2015-08-04 MED ORDER — ONDANSETRON HCL 4 MG/2ML IJ SOLN
INTRAMUSCULAR | Status: AC
  Filled 2015-08-04: qty 4

## 2015-08-04 MED ORDER — LIDOCAINE 2% (20 MG/ML) 5 ML SYRINGE
INTRAMUSCULAR | Status: AC
  Filled 2015-08-04: qty 10

## 2015-08-04 MED ORDER — MIDAZOLAM HCL 5 MG/5ML IJ SOLN
INTRAMUSCULAR | Status: DC | PRN
  Administered 2015-08-04: 2 mg via INTRAVENOUS

## 2015-08-04 MED ORDER — PROPOFOL 10 MG/ML IV BOLUS
INTRAVENOUS | Status: AC
  Filled 2015-08-04: qty 20

## 2015-08-04 MED ORDER — HYDROMORPHONE HCL 1 MG/ML IJ SOLN: 0.2500 mg | INTRAMUSCULAR | Status: DC | PRN

## 2015-08-04 MED ORDER — LIDOCAINE HCL (CARDIAC) 20 MG/ML IV SOLN
INTRAVENOUS | Status: DC | PRN
  Administered 2015-08-04: 100 mg via INTRAVENOUS

## 2015-08-04 MED ORDER — LIDOCAINE 2% (20 MG/ML) 5 ML SYRINGE
INTRAMUSCULAR | Status: AC
  Filled 2015-08-04: qty 5

## 2015-08-04 MED ORDER — LACTATED RINGERS IV SOLN
INTRAVENOUS | Status: DC | PRN
  Administered 2015-08-04: 08:00:00 via INTRAVENOUS

## 2015-08-04 MED ORDER — MIDAZOLAM HCL 2 MG/2ML IJ SOLN
INTRAMUSCULAR | Status: AC
  Filled 2015-08-04: qty 2

## 2015-08-04 MED ORDER — LACTATED RINGERS IV SOLN: INTRAVENOUS | Status: DC

## 2015-08-04 MED ORDER — CEFTRIAXONE SODIUM 2 G IJ SOLR
2.0000 g | INTRAMUSCULAR | Status: DC
  Filled 2015-08-04: qty 2

## 2015-08-04 SURGICAL SUPPLY — 58 items
APL SKNCLS STERI-STRIP NONHPOA (GAUZE/BANDAGES/DRESSINGS) ×1
APPLIER CLIP 5 13 M/L LIGAMAX5 (MISCELLANEOUS)
APR CLP MED LRG 5 ANG JAW (MISCELLANEOUS)
BAG SPEC RTRVL 10 TROC 200 (ENDOMECHANICALS) ×1
BAG SPEC RTRVL LRG 6X4 10 (ENDOMECHANICALS)
BENZOIN TINCTURE PRP APPL 2/3 (GAUZE/BANDAGES/DRESSINGS) ×3 IMPLANT
CANISTER SUCTION 2500CC (MISCELLANEOUS) ×3 IMPLANT
CHLORAPREP W/TINT 26ML (MISCELLANEOUS) ×3 IMPLANT
CLIP APPLIE 5 13 M/L LIGAMAX5 (MISCELLANEOUS) IMPLANT
CLIP LIGATING HEMO O LOK GREEN (MISCELLANEOUS) ×3 IMPLANT
CLOSURE WOUND 1/2 X4 (GAUZE/BANDAGES/DRESSINGS) ×1
COVER MAYO STAND STRL (DRAPES) ×1 IMPLANT
COVER SURGICAL LIGHT HANDLE (MISCELLANEOUS) ×3 IMPLANT
COVER TRANSDUCER ULTRASND (DRAPES) IMPLANT
DEVICE TROCAR PUNCTURE CLOSURE (ENDOMECHANICALS) ×3 IMPLANT
DISSECTOR BLUNT TIP ENDO 5MM (MISCELLANEOUS) ×2 IMPLANT
DRAPE C-ARM 42X72 X-RAY (DRAPES) ×1 IMPLANT
ELECT REM PT RETURN 9FT ADLT (ELECTROSURGICAL) ×3
ELECTRODE REM PT RTRN 9FT ADLT (ELECTROSURGICAL) ×1 IMPLANT
GAUZE SPONGE 2X2 8PLY STRL LF (GAUZE/BANDAGES/DRESSINGS) ×1 IMPLANT
GAUZE SPONGE 4X4 16PLY XRAY LF (GAUZE/BANDAGES/DRESSINGS) ×2 IMPLANT
GLOVE BIO SURGEON STRL SZ7.5 (GLOVE) ×5 IMPLANT
GLOVE BIOGEL PI IND STRL 6.5 (GLOVE) IMPLANT
GLOVE BIOGEL PI IND STRL 7.0 (GLOVE) IMPLANT
GLOVE BIOGEL PI INDICATOR 6.5 (GLOVE) ×2
GLOVE BIOGEL PI INDICATOR 7.0 (GLOVE) ×2
GLOVE ECLIPSE 6.0 STRL STRAW (GLOVE) ×2 IMPLANT
GLOVE SURG SS PI 6.5 STRL IVOR (GLOVE) ×2 IMPLANT
GOWN STRL REUS W/ TWL LRG LVL3 (GOWN DISPOSABLE) ×2 IMPLANT
GOWN STRL REUS W/ TWL XL LVL3 (GOWN DISPOSABLE) ×1 IMPLANT
GOWN STRL REUS W/TWL LRG LVL3 (GOWN DISPOSABLE) ×9
GOWN STRL REUS W/TWL XL LVL3 (GOWN DISPOSABLE) ×3
IV CATH 14GX2 1/4 (CATHETERS) ×1 IMPLANT
KIT BASIN OR (CUSTOM PROCEDURE TRAY) ×3 IMPLANT
KIT ROOM TURNOVER OR (KITS) ×3 IMPLANT
NDL INSUFFLATION 14GA 120MM (NEEDLE) ×1 IMPLANT
NEEDLE INSUFFLATION 14GA 120MM (NEEDLE) ×3 IMPLANT
NS IRRIG 1000ML POUR BTL (IV SOLUTION) ×3 IMPLANT
PAD ARMBOARD 7.5X6 YLW CONV (MISCELLANEOUS) ×6 IMPLANT
POUCH RETRIEVAL ECOSAC 10 (ENDOMECHANICALS) IMPLANT
POUCH RETRIEVAL ECOSAC 10MM (ENDOMECHANICALS) ×2
POUCH SPECIMEN RETRIEVAL 10MM (ENDOMECHANICALS) IMPLANT
SCISSORS LAP 5X35 DISP (ENDOMECHANICALS) ×3 IMPLANT
SET CHOLANGIOGRAPHY FRANKLIN (SET/KITS/TRAYS/PACK) ×1 IMPLANT
SET IRRIG TUBING LAPAROSCOPIC (IRRIGATION / IRRIGATOR) ×3 IMPLANT
SLEEVE ENDOPATH XCEL 5M (ENDOMECHANICALS) ×7 IMPLANT
SPECIMEN JAR SMALL (MISCELLANEOUS) ×3 IMPLANT
SPONGE GAUZE 2X2 STER 10/PKG (GAUZE/BANDAGES/DRESSINGS) ×2
STRIP CLOSURE SKIN 1/2X4 (GAUZE/BANDAGES/DRESSINGS) ×2 IMPLANT
SUT MNCRL AB 3-0 PS2 18 (SUTURE) ×5 IMPLANT
SUT VICRYL 0 UR6 27IN ABS (SUTURE) ×2 IMPLANT
TAPE CLOTH SURG 4X10 WHT LF (GAUZE/BANDAGES/DRESSINGS) ×2 IMPLANT
TOWEL OR 17X24 6PK STRL BLUE (TOWEL DISPOSABLE) ×3 IMPLANT
TOWEL OR 17X26 10 PK STRL BLUE (TOWEL DISPOSABLE) ×1 IMPLANT
TRAY LAPAROSCOPIC MC (CUSTOM PROCEDURE TRAY) ×3 IMPLANT
TROCAR XCEL NON-BLD 11X100MML (ENDOMECHANICALS) ×3 IMPLANT
TROCAR XCEL NON-BLD 5MMX100MML (ENDOMECHANICALS) ×3 IMPLANT
TUBING INSUFFLATION (TUBING) ×3 IMPLANT

## 2015-08-04 NOTE — Anesthesia Preprocedure Evaluation (Signed)
Anesthesia Evaluation  Patient identified by MRN, date of birth, ID band Patient awake    Reviewed: Allergy & Precautions, H&P , NPO status , Patient's Chart, lab work & pertinent test results  History of Anesthesia Complications Negative for: history of anesthetic complications  Airway Mallampati: II  TM Distance: >3 FB Neck ROM: full    Dental no notable dental hx.    Pulmonary Current Smoker,    Pulmonary exam normal breath sounds clear to auscultation       Cardiovascular negative cardio ROS Normal cardiovascular exam+ dysrhythmias Atrial Fibrillation  Rhythm:regular Rate:Normal     Neuro/Psych  Headaches, negative neurological ROS     GI/Hepatic negative GI ROS, Neg liver ROS,   Endo/Other  negative endocrine ROS  Renal/GU negative Renal ROS     Musculoskeletal   Abdominal   Peds  Hematology negative hematology ROS (+)   Anesthesia Other Findings   Reproductive/Obstetrics negative OB ROS                             Anesthesia Physical Anesthesia Plan  ASA: II  Anesthesia Plan: General   Post-op Pain Management:    Induction: Intravenous  Airway Management Planned: Oral ETT  Additional Equipment:   Intra-op Plan:   Post-operative Plan: Extubation in OR  Informed Consent: I have reviewed the patients History and Physical, chart, labs and discussed the procedure including the risks, benefits and alternatives for the proposed anesthesia with the patient or authorized representative who has indicated his/her understanding and acceptance.   Dental Advisory Given  Plan Discussed with: Anesthesiologist, CRNA and Surgeon  Anesthesia Plan Comments:         Anesthesia Quick Evaluation

## 2015-08-04 NOTE — Anesthesia Procedure Notes (Signed)
Procedure Name: Intubation Date/Time: 08/04/2015 9:31 AM Performed by: Carmela RimaMARTINELLI, Othello Dickenson F Pre-anesthesia Checklist: Patient being monitored, Suction available, Emergency Drugs available, Patient identified and Timeout performed Patient Re-evaluated:Patient Re-evaluated prior to inductionOxygen Delivery Method: Circle system utilized Preoxygenation: Pre-oxygenation with 100% oxygen Intubation Type: IV induction Ventilation: Mask ventilation without difficulty Laryngoscope Size: Mac and 3 (Recommend Mac 4  anatomy very deep) Grade View: Grade II Tube type: Oral Tube size: 7.5 mm Number of attempts: 1 Placement Confirmation: positive ETCO2,  ETT inserted through vocal cords under direct vision and breath sounds checked- equal and bilateral Secured at: 24 cm Tube secured with: Tape Dental Injury: Teeth and Oropharynx as per pre-operative assessment

## 2015-08-04 NOTE — Progress Notes (Signed)
Rings x 2 given to HobbsKatilyn from floor

## 2015-08-04 NOTE — Op Note (Signed)
08/04/2015  10:47 AM  PATIENT:  Timothy Glover  45 y.o. male  PRE-OPERATIVE DIAGNOSIS:  Acute cholelithiasis  POST-OPERATIVE DIAGNOSIS:  Acute cholelithiasis  PROCEDURE:  Procedure(s): LAPAROSCOPIC CHOLECYSTECTOMY (N/A)  SURGEON:  Surgeon(s) and Role:    * Axel FillerArmando Traven Davids, MD - Primary  ANESTHESIA:   local and general  EBL:   5cc  BLOOD ADMINISTERED:none  DRAINS: none   LOCAL MEDICATIONS USED:  BUPIVICAINE   SPECIMEN:  Source of Specimen:  gallbladder  DISPOSITION OF SPECIMEN:  PATHOLOGY  COUNTS:  YES  TOURNIQUET:  * No tourniquets in log *  DICTATION: .Dragon Dictation The patient was taken to the operating and placed in the supine position with bilateral SCDs in place. The patient was prepped and draped in the usual sterile fashion. A time out was called and all facts were verified. A pneumoperitoneum was obtained via A Veress needle technique to a pressure of 14mm of mercury.  A 5mm trochar was then placed in the right upper quadrant under visualization, and there were no injuries to any abdominal organs. A 11 mm port was then placed in the umbilical region after infiltrating with local anesthesia under direct visualization. A second and third epigastric port and right lower quadrant port placement under direct visualization, respectively. The gallbladder was drained and white bile suctioned out. The gallbladder was identified and retracted, the peritoneum was then sharply dissected from the gallbladder and this dissection was carried down to Calot's triangle. A 4th 5mm trochar was placed in the right subcostal margin to help with retraction of the colon and omentum. The gallbladder was identified and stripped away circumferentially and seen going into the gallbladder 360, the critical angle was obtained.    2 clips were placed proximally one distally and the cystic duct transected. The cystic artery was identified and 2 clips placed proximally and one distally and  transected. We then proceeded to remove the gallbladder off the hepatic fossa with Bovie cautery. A retrieval bag was then placed in the abdomen and gallbladder placed in the bag. The hepatic fossa was then reexamined and hemostasis was achieved with Bovie cautery and was excellent at the end of the case. The subhepatic fossa and perihepatic fossa was then irrigated until the effluent was clear.  The gallbladder and bag were removed from the abdominal cavity. The 11 mm trocar fascia was reapproximated with the Endo Close #1 Vicrylx3. The pneumoperitoneum was evacuated and all trochars removed under direct visulalization. The skin was then closed with 4-0 Monocryl and the skin dressed with Steri-Strips, gauze, and tape. The patient was awaken from general anesthesia and taken to the recovery room in stable condition.   PLAN OF CARE: Admit to inpatient   PATIENT DISPOSITION:  PACU - hemodynamically stable.   Delay start of Pharmacological VTE agent (>24hrs) due to surgical blood loss or risk of bleeding: not applicable

## 2015-08-04 NOTE — Transfer of Care (Signed)
Immediate Anesthesia Transfer of Care Note  Patient: Timothy EvansJoseph W Glover  Procedure(s) Performed: Procedure(s): LAPAROSCOPIC CHOLECYSTECTOMY (N/A)  Patient Location: PACU  Anesthesia Type:General  Level of Consciousness: awake, alert  and oriented  Airway & Oxygen Therapy: Patient Spontanous Breathing and Patient connected to nasal cannula oxygen  Post-op Assessment: Report given to RN, Post -op Vital signs reviewed and stable and Patient moving all extremities X 4  Post vital signs: Reviewed and stable  Last Vitals:  Filed Vitals:   08/04/15 0738 08/04/15 1105  BP: 142/87   Pulse: 94   Temp: 37.3 C 37.2 C  Resp: 18     Last Pain:  Filed Vitals:   08/04/15 1106  PainSc: 7       Patients Stated Pain Goal: 2 (08/04/15 0310)  Complications: No apparent anesthesia complications

## 2015-08-04 NOTE — Anesthesia Postprocedure Evaluation (Signed)
Anesthesia Post Note  Patient: Timothy Glover  Procedure(s) Performed: Procedure(s) (LRB): LAPAROSCOPIC CHOLECYSTECTOMY (N/A)  Patient location during evaluation: PACU Anesthesia Type: General Level of consciousness: awake and alert Pain management: pain level controlled Vital Signs Assessment: post-procedure vital signs reviewed and stable Respiratory status: spontaneous breathing, nonlabored ventilation, respiratory function stable and patient connected to nasal cannula oxygen Cardiovascular status: blood pressure returned to baseline and stable Postop Assessment: no signs of nausea or vomiting Anesthetic complications: no    Last Vitals:  Filed Vitals:   08/04/15 1126 08/04/15 1130  BP:  131/81  Pulse: 107 107  Temp:  36.6 C  Resp: 14 15    Last Pain:  Filed Vitals:   08/04/15 1134  PainSc: 0-No pain                 Reino KentJudd, Keonte Daubenspeck J

## 2015-08-05 ENCOUNTER — Encounter (HOSPITAL_COMMUNITY): Payer: Self-pay | Admitting: General Surgery

## 2015-08-05 MED ORDER — OXYCODONE-ACETAMINOPHEN 5-325 MG PO TABS: 1.0000 | ORAL_TABLET | ORAL | Status: AC | PRN

## 2015-08-05 NOTE — Discharge Instructions (Signed)
LAPAROSCOPIC SURGERY: POST OP INSTRUCTIONS  1. DIET: Follow a light bland diet the first 24 hours after arrival home, such as soup, liquids, crackers, etc.  Be sure to include lots of fluids daily.  Avoid fast food or heavy meals as your are more likely to get nauseated.  Eat a low fat the next few days after surgery.   2. Take your usually prescribed home medications unless otherwise directed. 3. PAIN CONTROL: a. Pain is best controlled by a usual combination of three different methods TOGETHER: i. Ice/Heat ii. Over the counter pain medication iii. Prescription pain medication b. Most patients will experience some swelling and bruising around the incisions.  Ice packs or heating pads (30-60 minutes up to 6 times a day) will help. Use ice for the first few days to help decrease swelling and bruising, then switch to heat to help relax tight/sore spots and speed recovery.  Some people prefer to use ice alone, heat alone, alternating between ice & heat.  Experiment to what works for you.  Swelling and bruising can take several weeks to resolve.   c. It is helpful to take an over-the-counter pain medication regularly for the first few weeks.  Choose one of the following that works best for you: i. Naproxen (Aleve, etc)  Two 229m tabs twice a day ii. Ibuprofen (Advil, etc) Three 2014mtabs four times a day (every meal & bedtime) iii. Acetaminophen (Tylenol, etc) 500-65096mour times a day (every meal & bedtime) d. A  prescription for pain medication (such as oxycodone, hydrocodone, etc) should be given to you upon discharge.  Take your pain medication as prescribed.  i. If you are having problems/concerns with the prescription medicine (does not control pain, nausea, vomiting, rash, itching, etc), please call us [phone] see if we need to switch you to a different pain medicine that will work better for you and/or control your side effect better. ii. If you need a refill on your pain medication,  please contact your pharmacy.  They will contact our office to request authorization. Prescriptions will not be filled after 5 pm or on week-ends. 4. Avoid getting constipated.  Between the surgery and the pain medications, it is common to experience some constipation.  Increasing fluid intake and taking a fiber supplement (such as Metamucil, Citrucel, FiberCon, MiraLax, etc) 1-2 times a day regularly will usually help prevent this problem from occurring.  A mild laxative (prune juice, Milk of Magnesia, MiraLax, etc) should be taken according to package directions if there are no bowel movements after 48 hours.   5. Watch out for diarrhea.  If you have many loose bowel movements, simplify your diet to bland foods & liquids for a few days.  Stop any stool softeners and decrease your fiber supplement.  Switching to mild anti-diarrheal medications (Kayopectate, Pepto Bismol) can help.  If this worsens or does not improve, please call us.[place]. Wash / shower every day.  You may shower over the dressings as they are waterproof.  Continue to shower over incision(s) after the dressing is off. 7. Remove your waterproof bandages 5 days after surgery.  You may leave the incision open to air.  You may replace a dressing/Band-Aid to cover the incision for comfort if you wish.  8. ACTIVITIES as tolerated:   a. You may resume regular (light) daily activities beginning the next day--such as daily self-care, walking, climbing stairs--gradually increasing activities as tolerated.  If you can walk 30 minutes without difficulty, it  is safe to try more intense activity such as jogging, treadmill, bicycling, low-impact aerobics, swimming, etc. °b. Save the most intensive and strenuous activity for last such as sit-ups, heavy lifting, contact sports, etc  Refrain from any heavy lifting or straining until you are off narcotics for pain control.   °c. DO NOT PUSH THROUGH PAIN.  Let pain be your guide: If it hurts to do something, don't  do it.  Pain is your body warning you to avoid that activity for another week until the pain goes down. °d. You may drive when you are no longer taking prescription pain medication, you can comfortably wear a seatbelt, and you can safely maneuver your car and apply brakes. °e. You may have sexual intercourse when it is comfortable.  °9. FOLLOW UP in our office °a. Please call [facility] at [phone] to set up an appointment to see your surgeon in the office for a follow-up appointment approximately 2-3 weeks after your surgery. °b. Make sure that you call for this appointment the day you arrive home to insure a convenient appointment time. °10. IF YOU HAVE DISABILITY OR FAMILY LEAVE FORMS, BRING THEM TO THE OFFICE FOR PROCESSING.  DO NOT GIVE THEM TO YOUR DOCTOR. ° ° °WHEN TO CALL US [phone]: °1. Poor pain control °2. Reactions / problems with new medications (rash/itching, nausea, etc)  °3. Fever over 101.5 F (38.5 C) °4. Inability to urinate °5. Nausea and/or vomiting °6. Worsening swelling or bruising °7. Continued bleeding from incision. °8. Increased pain, redness, or drainage from the incision ° ° The clinic staff is available to answer your questions during regular business hours (8:30am-5pm).  Please don’t hesitate to call and ask to speak to one of our nurses for clinical concerns.  ° If you have a medical emergency, go to the nearest emergency room or call 911. ° A surgeon from [facility] is always on call at the hospitals ° ° °[facility] °[address], [address], [place], [place]  [address] ? °MAIN: [phone] ? TOLL FREE: [phone] ?  °FAX [phone] °[email or web address] ° °

## 2015-08-05 NOTE — Discharge Summary (Signed)
Physician Discharge Summary  Patient ID: Timothy Glover MRN: [id number] DOB/AGE: [birth year] 45 y.o.  Admit date: 08/03/2015 Discharge date: 08/05/2015  Admitting Diagnosis: Acute cholecystitis   Discharge Diagnosis Patient Active Problem List   Diagnosis Date Noted  . Acute cholecystitis 08/03/2015    Consultants none  Imaging: Ct Angio Chest/abd/pel For Dissection W And/or W/wo  08/03/2015  CLINICAL DATA:  Chest and abdominal pain for 1 day. History of atrial fibrillation EXAM: CT ANGIOGRAPHY CHEST, ABDOMEN AND PELVIS TECHNIQUE: Initially, axial CT images were obtained through the chest without intravenous contrast material administration. Multidetector CT imaging through the chest, abdomen and pelvis was performed using the standard protocol during bolus administration of intravenous contrast. Multiplanar reconstructed images and MIPs were obtained and reviewed to evaluate the vascular anatomy. CONTRAST:  100 mL Isovue 370 nonionic COMPARISON:  Abdomen series Aug 03, 2015 FINDINGS: CTA CHEST FINDINGS There is no thoracic aortic aneurysm or dissection. Visualized great vessels appear normal. There are a few small areas of calcification in coronary arteries. Pericardium is not appreciably thickened. There is no demonstrable pulmonary embolus. Visualized thyroid appears unremarkable. There is no appreciable thoracic adenopathy. There is a minimal hiatal hernia. There are scattered areas of patchy atelectasis in both lower lobes. There is no lung edema or consolidation. There are no blastic or lytic bone lesions. Review of the MIP images confirms the above findings. CTA ABDOMEN AND PELVIS FINDINGS There is a degree of hepatic steatosis. No focal liver lesions are identified. There is pericholecystic fluid surrounding a gallbladder which appears somewhat distended. Gallbladder wall also appears slightly thickened. There is no appreciable biliary duct dilatation. Pancreas appears normal without mass  or inflammatory focus. No splenic lesions are evident. Right adrenal appears normal. There is a 9 x 9 mm left adrenal adenoma. Kidneys bilaterally show no hydronephrosis. There is a cyst measuring 8 x 8 mm arising from the lower pole the right kidney. There is a cyst arising from the lower pole left kidney measuring 1.1 x 1.1 cm. There is a cyst in upper pole left kidney anteriorly measuring 1.1 x 1.1 cm. There is no renal or ureteral calculus on either side. Urinary bladder is midline with wall thickness within normal limits. The abdominal aorta shows no evidence of aneurysm or dissection. There is mild atherosclerotic plaque in the distal aorta without appreciable vessel narrowing. The celiac, superior mesenteric, and inferior mesenteric arteries appear widely patent. Single renal arteries are patent bilaterally. In the pelvis, there is no aneurysm or dissection. There is no appreciable vessel narrowing involving major pelvic arterial vessels. In the pelvis, there is no mass or inflammatory focus. Prostate and seminal vesicles appear normal in size and contour. There is no bowel wall or mesenteric thickening. No bowel obstruction. No free air or portal venous air. The appendix appears normal. There is no ascites, adenopathy, or abscess in the abdomen or pelvis. There are no blastic or lytic bone lesions. There is no intramuscular or abdominal wall lesion. There is a minimal ventral hernia containing only fat. Review of the MIP images confirms the above findings. IMPRESSION: CT angiogram chest: Mild lower lobe atelectatic change. No edema or consolidation. No thoracic adenopathy. No thoracic aortic aneurysm or dissection. No pulmonary embolus evident. CT angiogram abdomen and pelvis: Findings concerning for acalculus cholecystitis. Correlation with ultrasound of the gallbladder advised. Evidence hepatic steatosis.  No focal liver lesions evident. Mild atherosclerotic change in the distal abdominal aorta. No aortic  or major pelvic arterial vessel aneurysm or dissection.  Major mesenteric vessels as well as the pelvic arterial vessels appear widely patent. No bowel obstruction. No bowel inflammation. Appendix appears normal. No abscess or ascites. No renal or ureteral calculus. No hydronephrosis. Minimal ventral hernia containing only fat. Electronically Signed   By: Bretta BangWilliam  Woodruff III M.D.   On: 08/03/2015 09:54   [place] Abdomen Limited Ruq  08/03/2015  CLINICAL DATA:  Epigastric pain since 1 a.m. last night. No known injury. Initial encounter. EXAM: US ABDOMEN LIMITED - RIGHT UPPER QUADRANT COMPARISON:  CT chest, abdomen and pelvis 08/03/2015. FINDINGS: Gallbladder: Innumerable stones are identified in the gallbladder including a 2.5 cm stone in the gallbladder neck. The gallbladder wall is thickened at 0.8 cm. No pericholecystic fluid is identified. Sonographer reports a positive Murphy sign. Common bile duct: Diameter: 0.6 cm Liver: A 1.1 cm in diameter hyperechoic lesion in the right hepatic lobe is most compatible with a hemangioma. Within normal limits in parenchymal echogenicity. IMPRESSION: Findings consistent with acute cholecystitis and patient with multiple gallstones including a 2.5 cm stone in the gallbladder neck. Electronically Signed   By: Drusilla Kannerhomas  Dalessio M.D.   On: 08/03/2015 11:10    Procedures Laparoscopic cholecystectomy---Dr. Derrell Lollingamirez   HPI: Timothy Glover is a 45 year old male with a history of tobacco use and remote atrial fibrillation who presents with epigastric abdominal pain which started suddenly at 1AM this morning. He reports intermittent pain between 1-3AM over the last several months, but would alleviate on its own. Symptoms are severe. Time pattern is constant. No aggravating or alleviating factors. Denies previous post prandial symptoms. Denies fever, chills or sweats. Denies diarrhea, constipation. ED work up reveals, WBC 10.8k. Normal LFTs. CT angio significant for acute  cholecystitis. Followed by an US which confirmed stones and cholecystitis. We have therefore been asked to evaluate.   Hospital Course:  Patient was admitted and underwent procedure listed above.  Tolerated procedure well and was transferred to the floor.  Diet was advanced as tolerated.  On POD#1, the patient was voiding well, tolerating diet, ambulating well, pain well controlled, vital signs stable, incisions c/d/i and felt stable for discharge home.  Medication risks, benefits and therapeutic alternatives were reviewed with the patient.  He verbalizes understanding.  Patient will follow up in our office in 3 weeks and knows to call with questions or concerns.  Physical Exam: General:  Alert, NAD, pleasant, comfortable Abd:  Soft, ND, mild tenderness, incisions C/D/I    Medication List    TAKE these medications        GOODY HEADACHE PO  Take 2 packets by mouth every 6 (six) hours as needed (pain).     GOODY PM PO  Take 2 Packages by mouth at bedtime as needed (pain).     oxyCODONE-acetaminophen 5-325 MG tablet  Commonly known as:  PERCOCET/ROXICET  Take 1-2 tablets by mouth every 4 (four) hours as needed for moderate pain.             Follow-up Information    Follow up with [facility] On 08/26/2015.   Specialty:  General Surgery   Why:  arrive by 8:30am for a 9AM post operative check up.    Contact information:   [address] [place] [address] [phone]       Signed: Ashok Norrismina Makaiya Geerdes, [facility]NP[facility]BC [facility] [phone]  08/05/2015, 9:27 AM

## 2017-07-20 IMAGING — US US ABDOMEN LIMITED
1 series · 14 of 25 positions shown · non-contrast
Comparison: CT chest, abdomen and pelvis 08/03/2015.

CLINICAL DATA: Epigastric pain since 1 a.m. last night. No known
injury. Initial encounter.

EXAM:
US ABDOMEN LIMITED - RIGHT UPPER QUADRANT

[Series 1: us abdomen limited · 0.25mm/px · 14 of 27 slices shown]
[im 1/27]
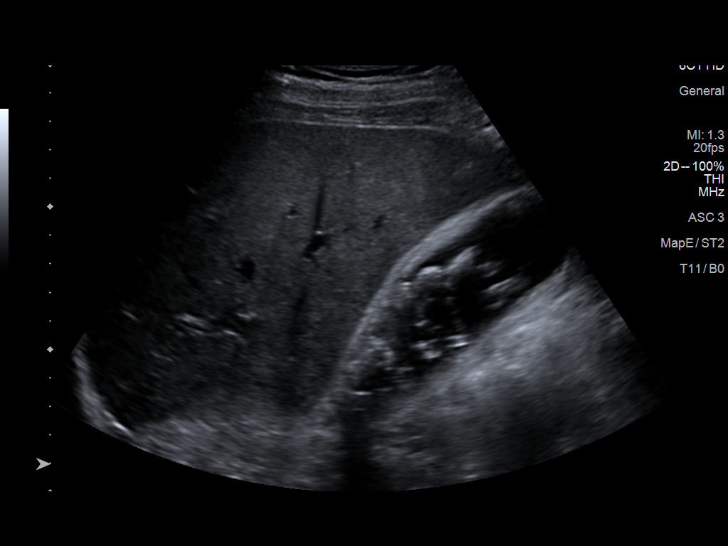
[im 3/27]
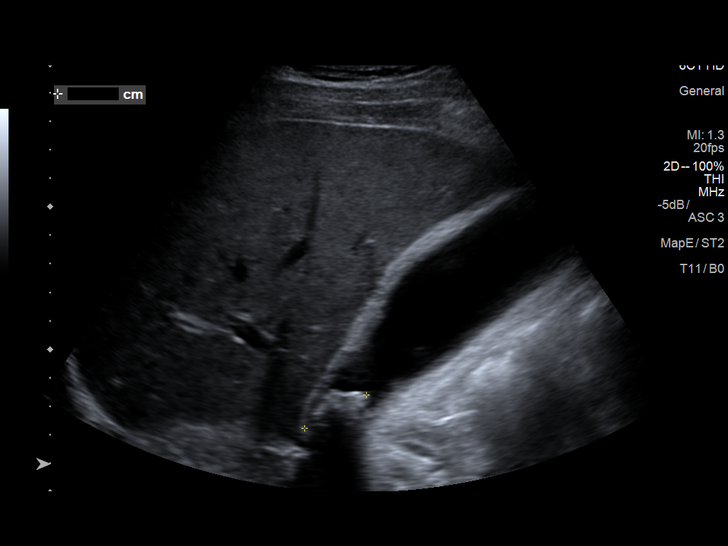
[im 5/27]
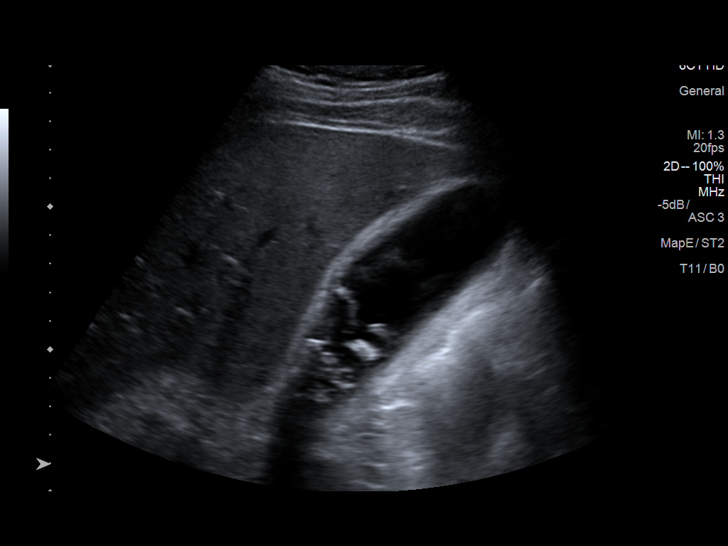
[im 7/27]
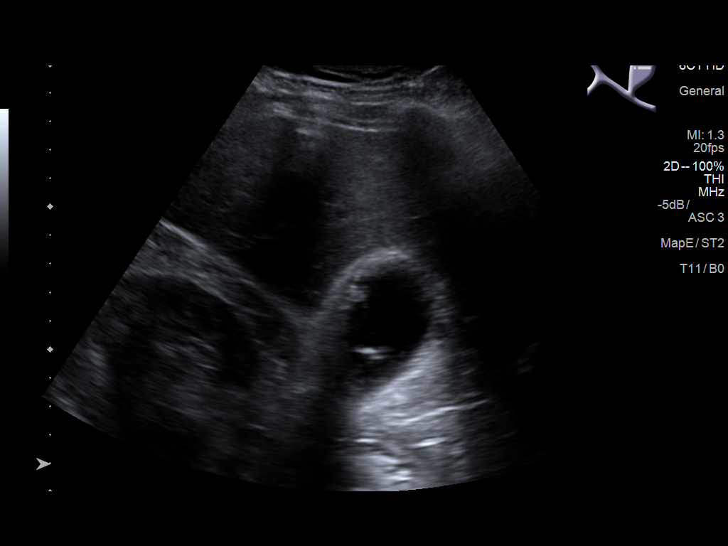
[im 9/27]
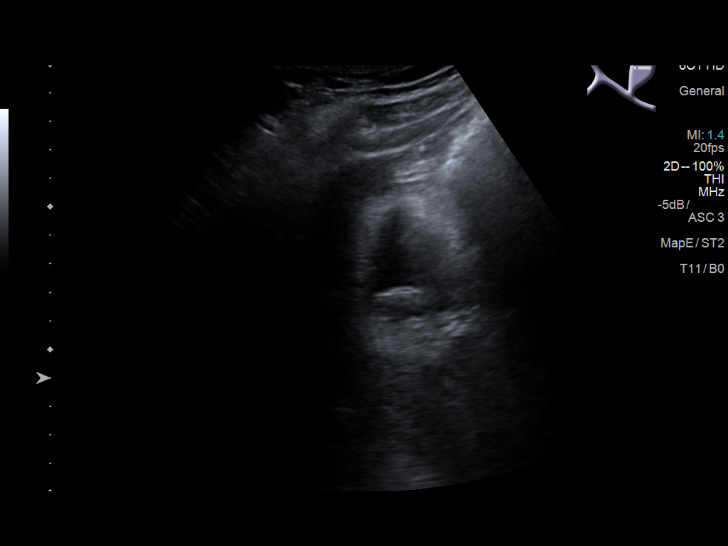
[im 10/27]
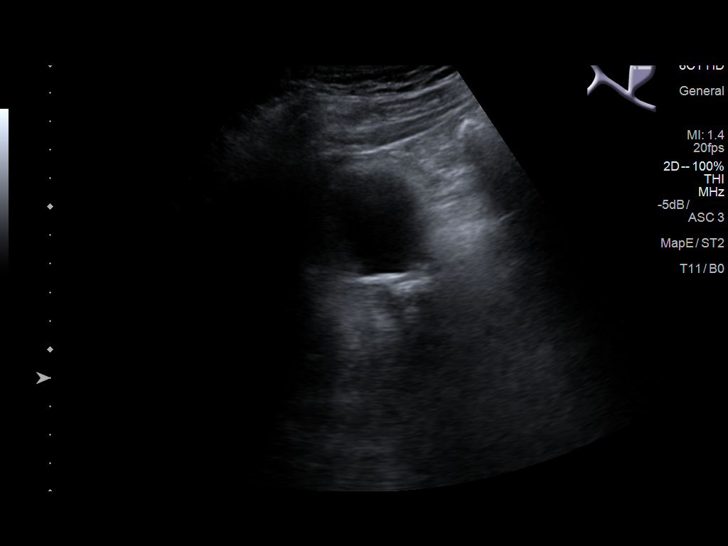
[im 12/27]
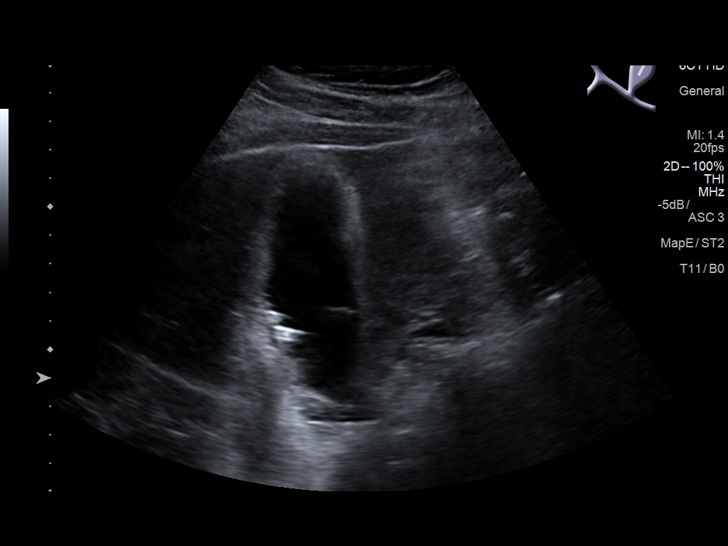
[im 15/27]
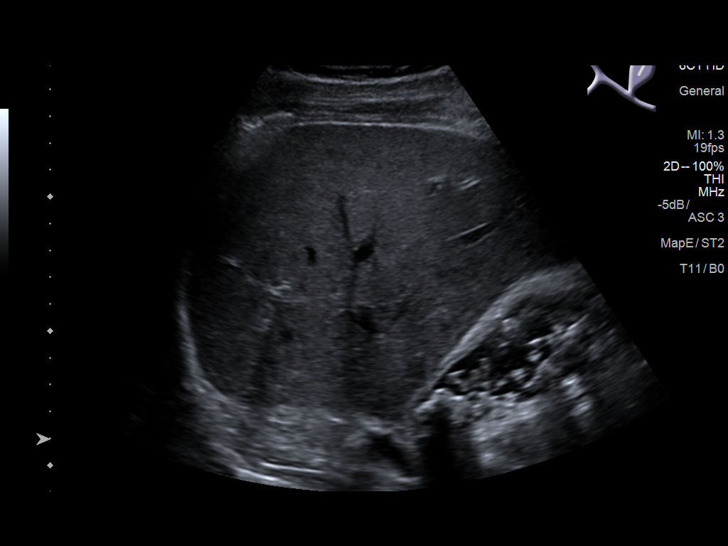
[im 17/27]
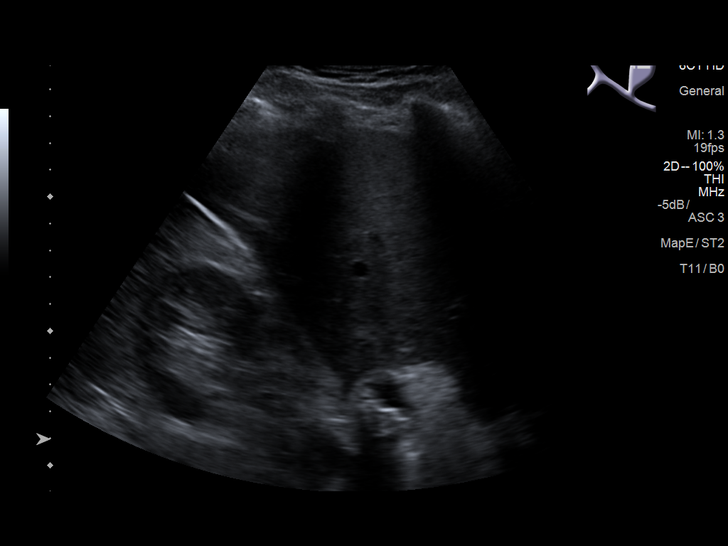
[im 18/27]
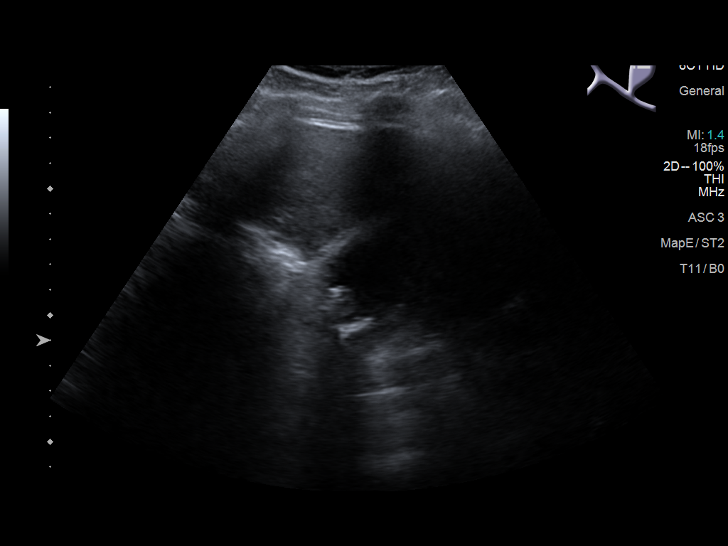
[im 20/27]
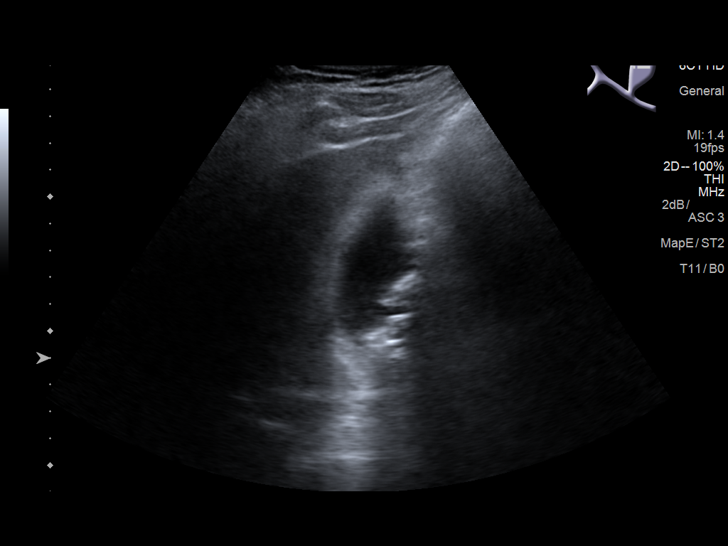
[im 22/27]
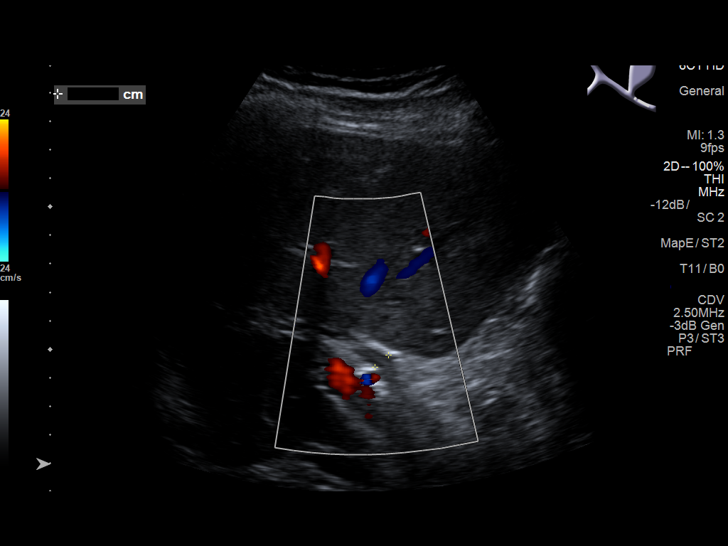
[im 24/27]
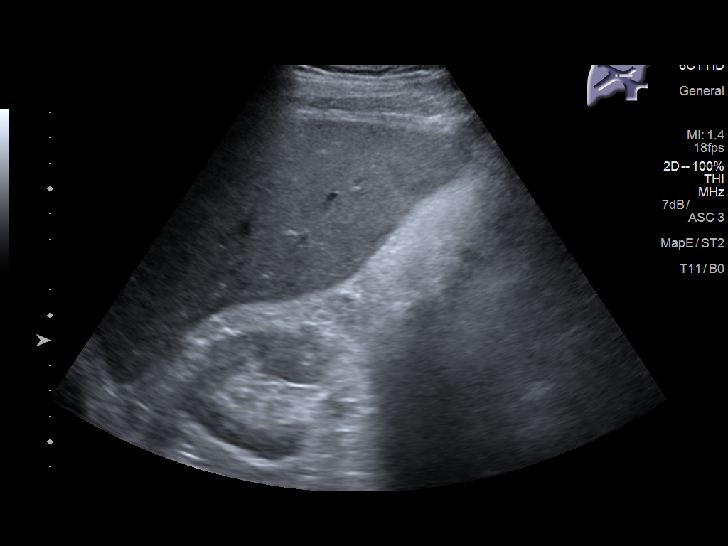
[im 27/27]
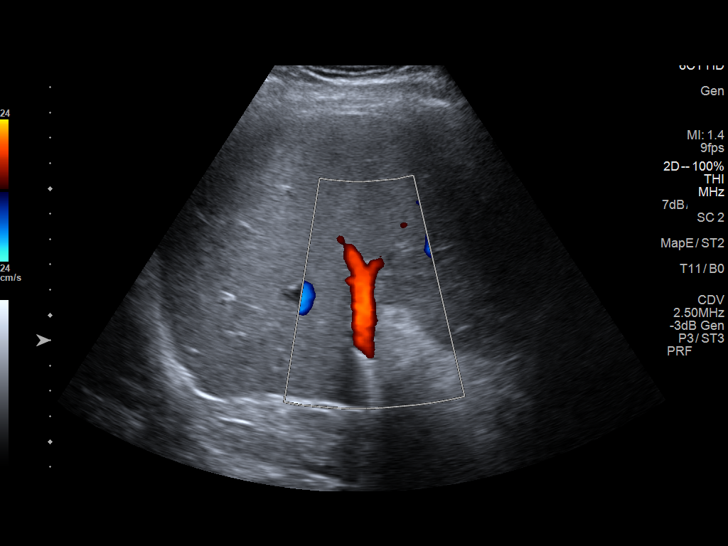

[14 of 25 positions shown; findings below may reference images not displayed]

FINDINGS: Gallbladder:

Innumerable stones are identified in the gallbladder including a
cm stone in the gallbladder neck. The gallbladder wall is thickened
at 0.8 cm. No pericholecystic fluid is identified. Sonographer
reports a positive Murphy sign.

Common bile duct:

Diameter: 0.6 cm

Liver:

A 1.1 cm in diameter hyperechoic lesion in the right hepatic lobe is
most compatible with a hemangioma. Within normal limits in
parenchymal echogenicity.
IMPRESSION: Findings consistent with acute cholecystitis and patient with
multiple gallstones including a 2.5 cm stone in the gallbladder
neck.
